# Patient Record
Sex: Female | Born: 1995 | Race: Black or African American | Hispanic: No | Marital: Single | Smoking: Never smoker
Health system: Southern US, Community
[De-identification: ages and names within clinical notes are randomized; demographics above are authoritative.]

---

## 2015-05-15 ENCOUNTER — Encounter (HOSPITAL_COMMUNITY): Payer: Self-pay | Admitting: Emergency Medicine

## 2015-05-15 DIAGNOSIS — Z3202 Encounter for pregnancy test, result negative: Secondary | ICD-10-CM | POA: Diagnosis not present

## 2015-05-15 DIAGNOSIS — R300 Dysuria: Secondary | ICD-10-CM | POA: Diagnosis present

## 2015-05-15 DIAGNOSIS — N390 Urinary tract infection, site not specified: Secondary | ICD-10-CM | POA: Insufficient documentation

## 2015-05-15 LAB — COMPREHENSIVE METABOLIC PANEL
ALBUMIN: 4.1 g/dL (ref 3.5–5.0)
ALK PHOS: 56 U/L (ref 38–126)
ALT: 15 U/L (ref 14–54)
AST: 21 U/L (ref 15–41)
Anion gap: 8 (ref 5–15)
BUN: 14 mg/dL (ref 6–20)
CHLORIDE: 105 mmol/L (ref 101–111)
CO2: 24 mmol/L (ref 22–32)
CREATININE: 0.78 mg/dL (ref 0.44–1.00)
Calcium: 9 mg/dL (ref 8.9–10.3)
GFR calc Af Amer: >60 mL/min (ref >60)
GFR calc non Af Amer: >60 mL/min (ref >60)
GLUCOSE: 95 mg/dL (ref 65–99)
Potassium: 3.7 mmol/L (ref 3.5–5.1)
SODIUM: 137 mmol/L (ref 135–145)
Total Bilirubin: 1.3 mg/dL — ABNORMAL HIGH (ref 0.3–1.2)
Total Protein: 7.6 g/dL (ref 6.5–8.1)

## 2015-05-15 LAB — LIPASE, BLOOD: LIPASE: 26 U/L (ref 22–51)

## 2015-05-15 LAB — I-STAT BETA HCG BLOOD, ED (MC, WL, AP ONLY): I-stat hCG, quantitative: <5 m[IU]/mL (ref <5)

## 2015-05-15 LAB — URINALYSIS, ROUTINE W REFLEX MICROSCOPIC
Bilirubin Urine: NEGATIVE
GLUCOSE, UA: NEGATIVE mg/dL
KETONES UR: 15 mg/dL — AB
LEUKOCYTES UA: NEGATIVE
Nitrite: NEGATIVE
PH: 7 (ref 5.0–8.0)
Protein, ur: >300 mg/dL — AB
Specific Gravity, Urine: 1.027 (ref 1.005–1.030)
Urobilinogen, UA: 4 mg/dL — ABNORMAL HIGH (ref 0.0–1.0)

## 2015-05-15 LAB — CBC
HCT: 37.1 % (ref 36.0–46.0)
Hemoglobin: 12.1 g/dL (ref 12.0–15.0)
MCH: 28.6 pg (ref 26.0–34.0)
MCHC: 32.6 g/dL (ref 30.0–36.0)
MCV: 87.7 fL (ref 78.0–100.0)
PLATELETS: 301 10*3/uL (ref 150–400)
RBC: 4.23 MIL/uL (ref 3.87–5.11)
RDW: 12.6 % (ref 11.5–15.5)
WBC: 6.9 10*3/uL (ref 4.0–10.5)

## 2015-05-15 LAB — URINE MICROSCOPIC-ADD ON

## 2015-05-15 NOTE — ED Notes (Signed)
Patient here with complaint of lower abdominal pain and dysuria. States onset was 9/2. Was seen by student health at [facility] for dysuria. Was told to take pyridium. Presents tonight because symptoms have gotten worse and pain has progressed.

## 2015-05-16 ENCOUNTER — Encounter (HOSPITAL_COMMUNITY): Payer: Self-pay | Admitting: Radiology

## 2015-05-16 ENCOUNTER — Emergency Department (HOSPITAL_COMMUNITY)
Admission: EM | Admit: 2015-05-16 | Discharge: 2015-05-16 | Disposition: A | Discharge status: Home / Self Care | Payer: Federal, State, Local not specified - PPO | Attending: Emergency Medicine | Admitting: Emergency Medicine

## 2015-05-16 ENCOUNTER — Emergency Department (HOSPITAL_COMMUNITY): Payer: Federal, State, Local not specified - PPO

## 2015-05-16 DIAGNOSIS — R319 Hematuria, unspecified: Secondary | ICD-10-CM

## 2015-05-16 DIAGNOSIS — N390 Urinary tract infection, site not specified: Secondary | ICD-10-CM

## 2015-05-16 LAB — WET PREP, GENITAL
Trich, Wet Prep: NONE SEEN
YEAST WET PREP: NONE SEEN

## 2015-05-16 LAB — HIV ANTIBODY (ROUTINE TESTING W REFLEX): HIV SCREEN 4TH GENERATION: NONREACTIVE

## 2015-05-16 LAB — GC/CHLAMYDIA PROBE AMP (~~LOC~~) NOT AT ARMC
Chlamydia: NEGATIVE
NEISSERIA GONORRHEA: NEGATIVE

## 2015-05-16 MED ORDER — LEVOFLOXACIN 750 MG PO TABS
750.0000 mg | ORAL_TABLET | Freq: Once | ORAL | Status: AC
  Administered 2015-05-16: 750 mg via ORAL
  Filled 2015-05-16: qty 1

## 2015-05-16 MED ORDER — LEVOFLOXACIN 750 MG PO TABS: 750.0000 mg | ORAL_TABLET | Freq: Every day | ORAL | Status: AC

## 2015-05-16 NOTE — Discharge Instructions (Signed)
Hematuria Ms. Tony, your urine shows a possible infection, also see on CT scan. See urology within 3 days for close follow-up. Take Anaprox as directed, if symptoms worsen come back to emergency department immediately. Thank you. Hematuria is blood in your urine. It can be caused by a bladder infection, kidney infection, prostate infection, kidney stone, or cancer of your urinary tract. Infections can usually be treated with medicine, and a kidney stone usually will pass through your urine. If neither of these is the cause of your hematuria, further workup to find out the reason may be needed. It is very important that you tell your health care provider about any blood you see in your urine, even if the blood stops without treatment or happens without causing pain. Blood in your urine that happens and then stops and then happens again can be a symptom of a very serious condition. Also, pain is not a symptom in the initial stages of many urinary cancers. HOME CARE INSTRUCTIONS   Drink lots of fluid, 3-4 quarts a day. If you have been diagnosed with an infection, cranberry juice is especially recommended, in addition to large amounts of water.  Avoid caffeine, tea, and carbonated beverages because they tend to irritate the bladder.  Avoid alcohol because it may irritate the prostate.  Take all medicines as directed by your health care provider.  If you were prescribed an antibiotic medicine, finish it all even if you start to feel better.  If you have been diagnosed with a kidney stone, follow your health care provider's instructions regarding straining your urine to catch the stone.  Empty your bladder often. Avoid holding urine for long periods of time.  After a bowel movement, women should cleanse front to back. Use each tissue only once.  Empty your bladder before and after sexual intercourse if you are a female. SEEK MEDICAL CARE IF:  You develop back pain.  You have a fever.  You  have a feeling of sickness in your stomach (nausea) or vomiting.  Your symptoms are not better in 3 days. Return sooner if you are getting worse. SEEK IMMEDIATE MEDICAL CARE IF:   You develop severe vomiting and are unable to keep the medicine down.  You develop severe back or abdominal pain despite taking your medicines.  You begin passing a large amount of blood or clots in your urine.  You feel extremely weak or faint, or you pass out. MAKE SURE YOU:   Understand these instructions.  Will watch your condition.  Will get help right away if you are not doing well or get worse. Document Released: 08/19/2005 Document Revised: 01/03/2014 Document Reviewed: 04/19/2013 [facility] Patient Information 2015 [place], [place]. This information is not intended to replace advice given to you by your health care provider. Make sure you discuss any questions you have with your health care provider.

## 2015-05-16 NOTE — ED Provider Notes (Signed)
CSN: [id number]     Arrival date & time 05/15/15  2056 History   This chart was scribed for Tomasita Crumble, MD by Evon Slack, ED Scribe. This patient was seen in room A13C/A13C and the patient's care was started at 1:26 AM.     Chief Complaint  Patient presents with  . Pelvic Pain  . Dysuria    Patient is a 19 y.o. female presenting with pelvic pain and dysuria. The history is provided by the patient. No language interpreter was used.  Pelvic Pain This is a recurrent problem. The current episode started 12 to 24 hours ago. The problem has not changed since onset.Associated symptoms include abdominal pain. She has tried nothing for the symptoms.  Dysuria Associated symptoms: abdominal pain    HPI Comments: Samantha Beck is a 19 y.o. female who presents to the Emergency Department complaining of low abdominal pressure onset today at 4 PM. Pt states she has associated urinary frequency, dysuria and worsening hematuria. Pt states that on 05/05/2015 she had similar symptoms and was dx with a UTI. She states she was prescribed antibiotics and her symptoms resolved until today at 4 PM. Pt states that her menstrual cycles have been normal. Pt denies back pain or blood in her stool.    History reviewed. No pertinent past medical history. History reviewed. No pertinent past surgical history. History reviewed. No pertinent family history. Social History  Substance Use Topics  . Smoking status: Never Smoker   . Smokeless tobacco: None  . Alcohol Use: No   OB History    No data available     Review of Systems  Gastrointestinal: Positive for abdominal pain.  Genitourinary: Positive for dysuria, frequency, hematuria and pelvic pain.  Musculoskeletal: Negative for back pain.  All other systems reviewed and are negative.    Allergies  Review of patient's allergies indicates no known allergies.  Home Medications   Prior to Admission medications   Not on File   BP 130/97 mmHg   Pulse 78  Temp(Src) 97.6 F (36.4 C) (Oral)  Resp 16  SpO2 100%  LMP 05/09/2015 (Exact Date)   Physical Exam  Constitutional: She is oriented to person, place, and time. She appears well-developed and well-nourished. No distress.  HENT:  Head: Normocephalic and atraumatic.  Nose: Nose normal.  Mouth/Throat: Oropharynx is clear and moist. No oropharyngeal exudate.  Eyes: Conjunctivae and EOM are normal. Pupils are equal, round, and reactive to light. No scleral icterus.  Neck: Normal range of motion. Neck supple. No JVD present. No tracheal deviation present. No thyromegaly present.  Cardiovascular: Normal rate, regular rhythm and normal heart sounds.  Exam reveals no gallop and no friction rub.   No murmur heard. Pulmonary/Chest: Effort normal and breath sounds normal. No respiratory distress. She has no wheezes. She exhibits no tenderness.  Abdominal: Soft. Bowel sounds are normal. She exhibits no distension and no mass. There is no tenderness. There is no rebound and no guarding.  Musculoskeletal: Normal range of motion. She exhibits no edema or tenderness.  Lymphadenopathy:    She has no cervical adenopathy.  Neurological: She is alert and oriented to person, place, and time. No cranial nerve deficit. She exhibits normal muscle tone.  Skin: Skin is warm and dry. No rash noted. No erythema. No pallor.  Nursing note and vitals reviewed.   ED Course  Procedures (including critical care time) DIAGNOSTIC STUDIES: Oxygen Saturation is 100% on RA, normal by my interpretation.    COORDINATION OF  CARE: 1:56 AM-Discussed treatment plan with pt at bedside and pt agreed to plan.     Labs Review Labs Reviewed  COMPREHENSIVE METABOLIC PANEL - Abnormal; Notable for the following:    Total Bilirubin 1.3 (*)    All other components within normal limits  URINALYSIS, ROUTINE W REFLEX MICROSCOPIC (NOT AT [facility]) - Abnormal; Notable for the following:    Color, Urine RED (*)    APPearance  TURBID (*)    Hgb urine dipstick LARGE (*)    Ketones, ur 15 (*)    Protein, ur >300 (*)    Urobilinogen, UA 4.0 (*)    All other components within normal limits  URINE MICROSCOPIC-ADD ON - Abnormal; Notable for the following:    Bacteria, UA FEW (*)    All other components within normal limits  WET PREP, GENITAL  LIPASE, BLOOD  CBC  HIV ANTIBODY (ROUTINE TESTING)  I-STAT BETA HCG BLOOD, ED ([facility], [facility], [facility] ONLY)  GC/CHLAMYDIA PROBE AMP () NOT AT [facility]    Imaging Review Ct Renal Stone Study  05/16/2015   CLINICAL DATA:  19 year old female with hematuria  EXAM: CT ABDOMEN AND PELVIS WITHOUT CONTRAST  TECHNIQUE: Multidetector CT imaging of the abdomen and pelvis was performed following the standard protocol without IV contrast.  COMPARISON:  None.  FINDINGS: Evaluation of this exam is limited in the absence of intravenous contrast. Evaluation is also limited due to respiratory motion artifact.  The visualized lung bases are clear. No intra-abdominal free air. Trace free fluid may be present within the pelvis.  The liver, and gallbladder appear unremarkable. The pancreas is grossly unremarkable. There is mild diffuse mesenteric stranding. Correlation with pancreatic enzymes recommended to exclude pancreatitis. The spleen, adrenal glands, kidneys, visualized ureters appear unremarkable. The urinary bladder is collapsed. The uterus is anteverted. Evaluation of the uterus is limited due to suboptimal visualization. ultrasound may provide better evaluation of the pelvic structures.  Evaluation of the bowel is limited in the absence of oral contrast. There is apparent wall thickening of the wall of the stomach. Clinical correlation is recommended to evaluate for gastritis. Moderate stool noted throughout the colon. There is no evidence of bowel obstruction. Normal appendix.  The visualized abdominal aorta and IVC is grossly unremarkable. No portal venous gas identified. There is no lymphadenopathy.  There is mild subcutaneous soft tissue stranding of the abdominal wall. The osseous structures appear unremarkable.  IMPRESSION: No hydronephrosis or nephrolithiasis. Correlation with urinalysis recommended to exclude UTI.  No bowel obstruction.  Normal appendix.  Minimally thickened appearance of the gastric wall. Clinical correlation is recommended to exclude gastritis.  Mild diffuse mesenteric haziness. Correlation with pancreatic enzymes and liver function tests recommended.   Electronically Signed   By: Elgie Collard M.D.   On: 05/16/2015 03:04      EKG Interpretation None      MDM   Final diagnoses:  None    Patient presents to the ED for hematuria.  Her symptoms improved with antibiotics last time.  UA here only shows few bacteria and some WBC.  She is having mild dysuria so I will treat.  CT scan also shows possible cystitis.  CT also reveal mild diffuse mesenteric haziness and thickening of the gastric wall. Neither of these would explain the patient's hematuria and I do not think they're related. Patient will be given Levaquin for UTI as this is her second time in one month. Urology follow-up was provided.  She otherwise appears well in no  acute distress, vital signs remain within her normal limits and she is safe for discharge.   I personally performed the services described in this documentation, which was scribed in my presence. The recorded information has been reviewed and is accurate.    Tomasita Crumble, MD 05/16/15 [phone]

## 2015-05-16 NOTE — ED Notes (Signed)
Pt denies any abd pain st's she does have pain on urination. Pt denies nausea or vomiting.  Denies vag. discharge

## 2016-07-18 IMAGING — CT CT RENAL STONE PROTOCOL
2 of 4 series · 16 of 46 positions shown, 18 images · non-contrast
Comparison: None.

CLINICAL DATA: 18-year-old female with hematuria

EXAM:
CT ABDOMEN AND PELVIS WITHOUT CONTRAST
TECHNIQUE: Multidetector CT imaging of the abdomen and pelvis was performed
following the standard protocol without IV contrast.

[Series 2: stone study 5.0 i30f 1 · axial · 0.66mm/px · z∈[-427,-32]mm · 13 of 87 slices shown, 15 images]
[im 4/87  soft-tissue]
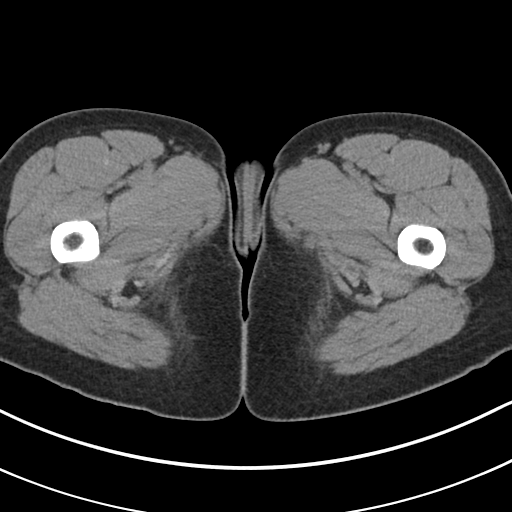
[im 4/87  bone]
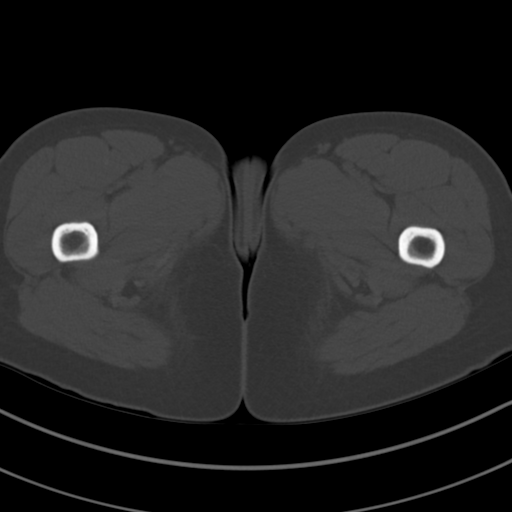
[im 12/87  soft-tissue]
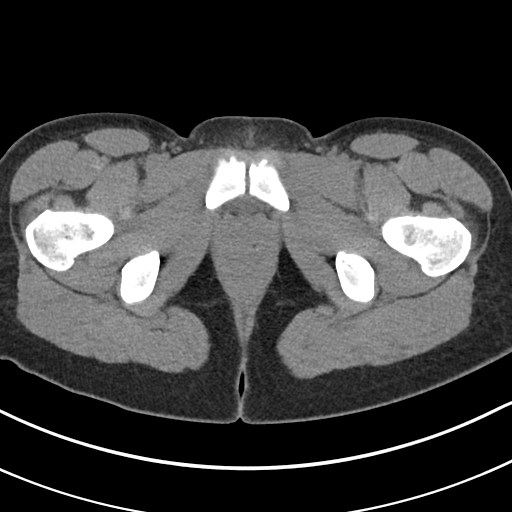
[im 19/87  soft-tissue]
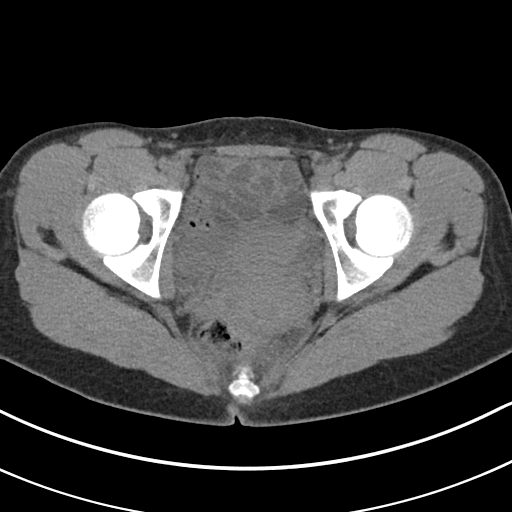
[im 23/87  soft-tissue]
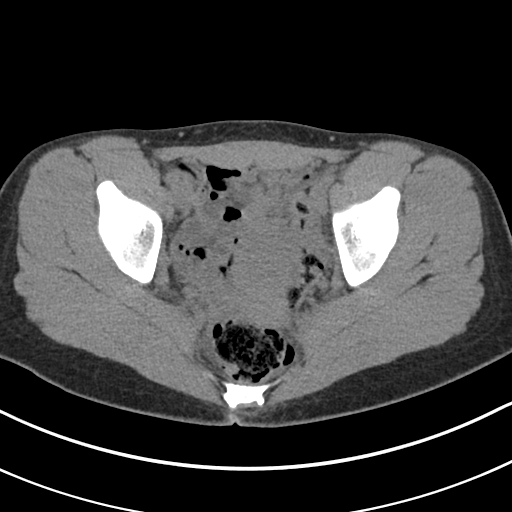
[im 30/87  soft-tissue]
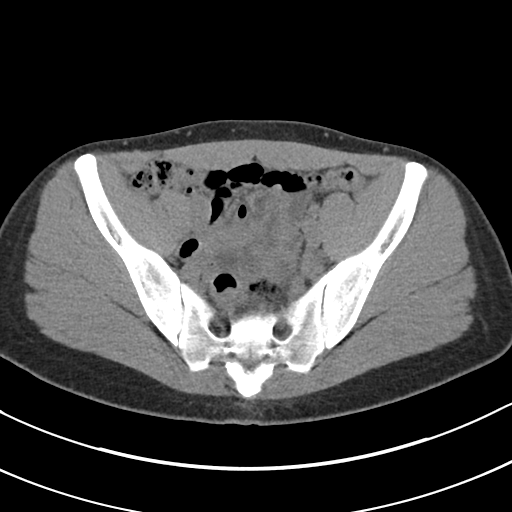
[im 38/87  soft-tissue]
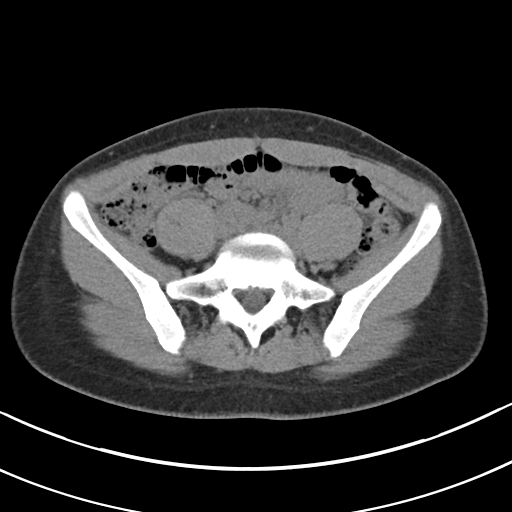
[im 45/87  soft-tissue]
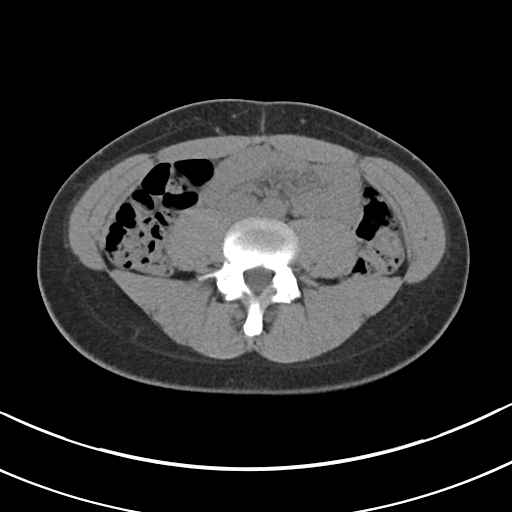
[im 49/87  soft-tissue]
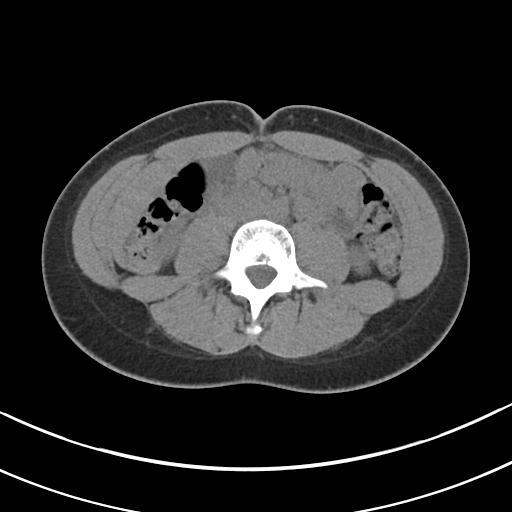
[im 57/87  soft-tissue]
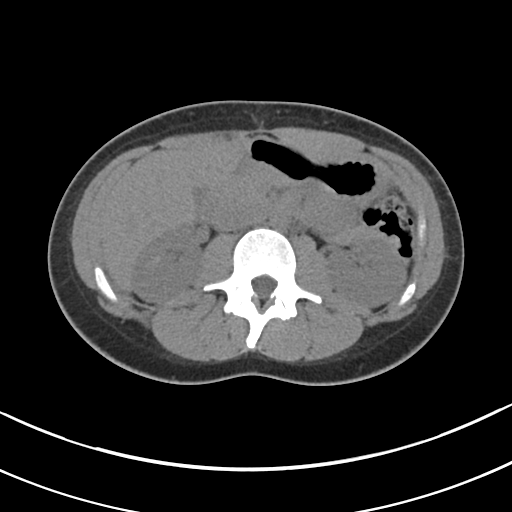
[im 57/87  bone]
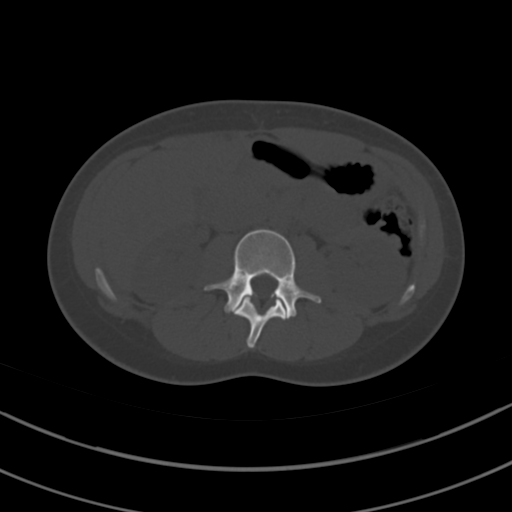
[im 64/87  soft-tissue]
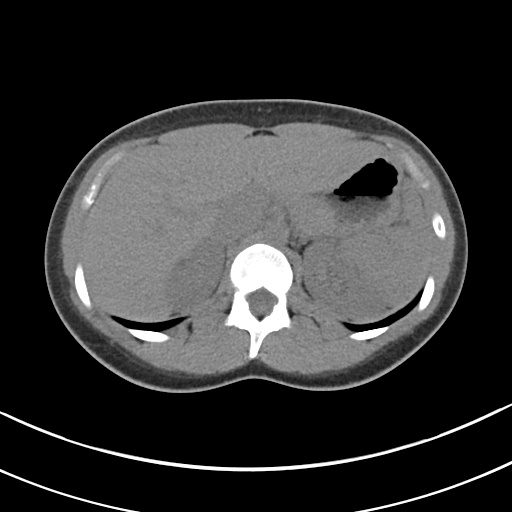
[im 68/87  soft-tissue]
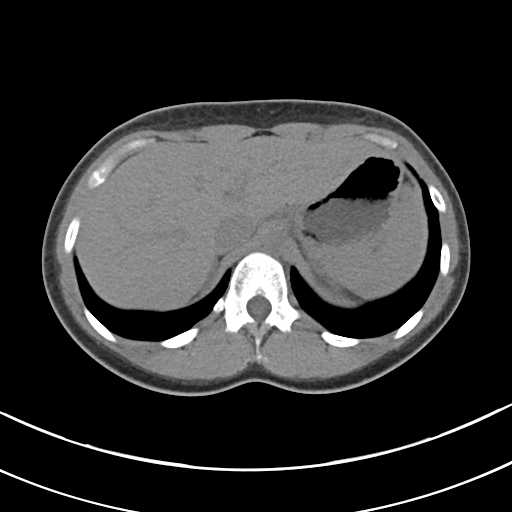
[im 75/87  soft-tissue]
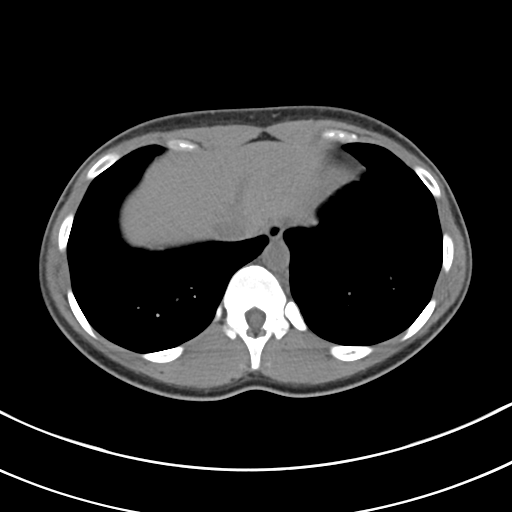
[im 83/87  soft-tissue]
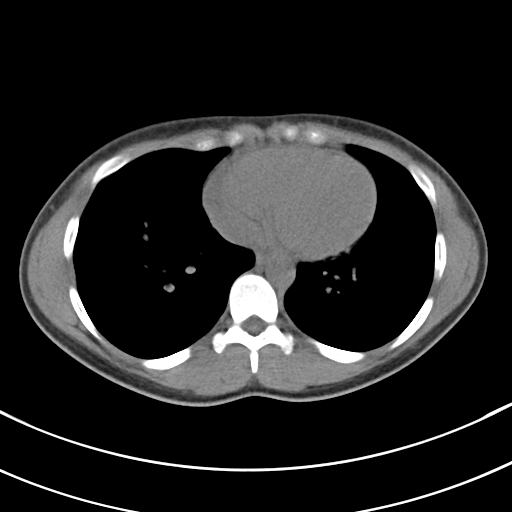

[Series 5: coronal soft tissue · coronal · 0.67mm/px · 3 of 62 slices shown]
[im 21/62  soft-tissue]
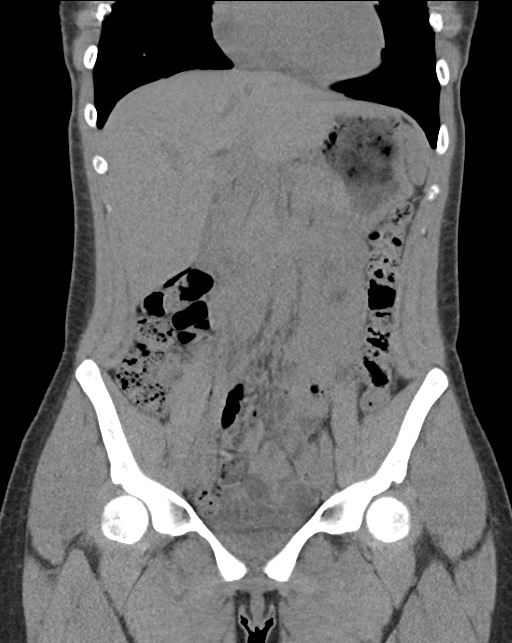
[im 28/62  soft-tissue]
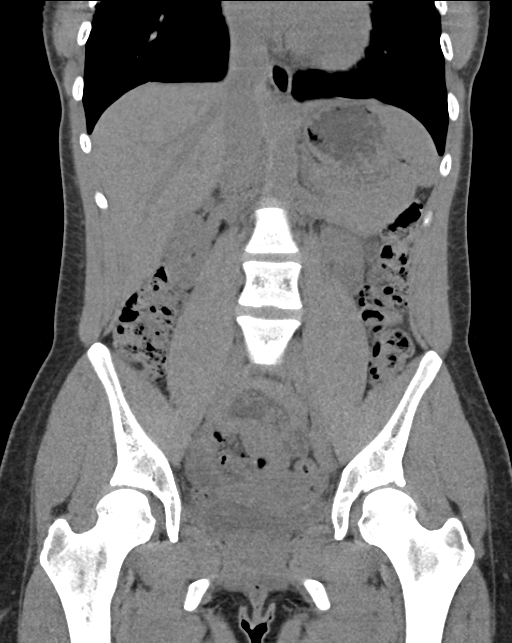
[im 34/62  soft-tissue]
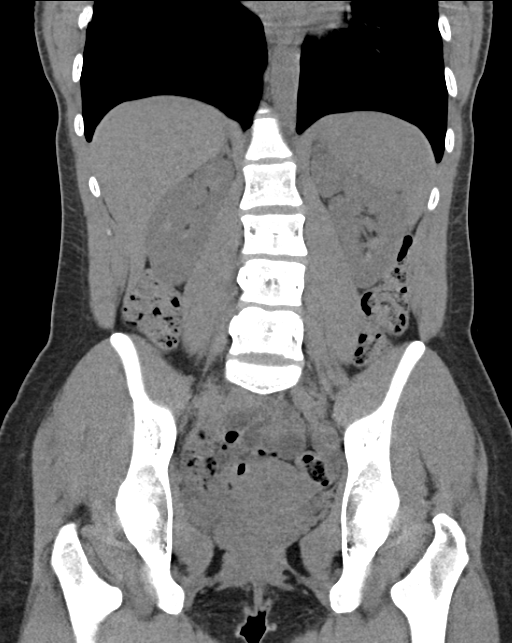

[16 of 46 positions shown; findings below may reference images not displayed]

FINDINGS: Evaluation of this exam is limited in the absence of intravenous
contrast. Evaluation is also limited due to respiratory motion
artifact.

The visualized lung bases are clear. No intra-abdominal free air.
Trace free fluid may be present within the pelvis.

The liver, and gallbladder appear unremarkable. The pancreas is
grossly unremarkable. There is mild diffuse mesenteric stranding.
Correlation with pancreatic enzymes recommended to exclude
pancreatitis. The spleen, adrenal glands, kidneys, visualized
ureters appear unremarkable. The urinary bladder is collapsed. The
uterus is anteverted. Evaluation of the uterus is limited due to
suboptimal visualization. ultrasound may provide better evaluation
of the pelvic structures.

Evaluation of the bowel is limited in the absence of oral contrast.
There is apparent wall thickening of the wall of the stomach.
Clinical correlation is recommended to evaluate for gastritis.
Moderate stool noted throughout the colon. There is no evidence of
bowel obstruction. Normal appendix.

The visualized abdominal aorta and IVC is grossly unremarkable. No
portal venous gas identified. There is no lymphadenopathy. There is
mild subcutaneous soft tissue stranding of the abdominal wall. The
osseous structures appear unremarkable.
IMPRESSION: No hydronephrosis or nephrolithiasis. Correlation with urinalysis
recommended to exclude UTI.

No bowel obstruction.  Normal appendix.

Minimally thickened appearance of the gastric wall. Clinical
correlation is recommended to exclude gastritis.

Mild diffuse mesenteric haziness. Correlation with pancreatic
enzymes and liver function tests recommended.
